# Patient Record
Sex: Female | Born: 1970 | Race: White | Hispanic: No
Health system: Midwestern US, Academic
[De-identification: ages and names within clinical notes are randomized; demographics above are authoritative.]

---

## 2010-04-02 IMAGING — US US PELVIC TRANSVAGINAL
1 series · 17 of 25 positions shown · non-contrast
Comparison: none

[Series 1: us pelvic transvaginal · 17 of 41 slices shown]
[im 1/41]
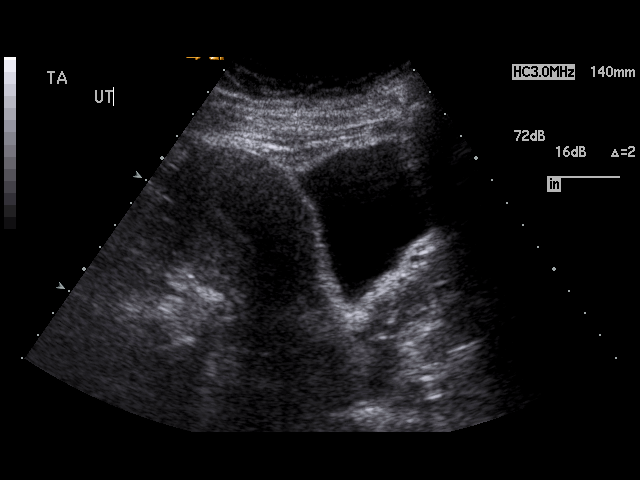
[im 4/41]
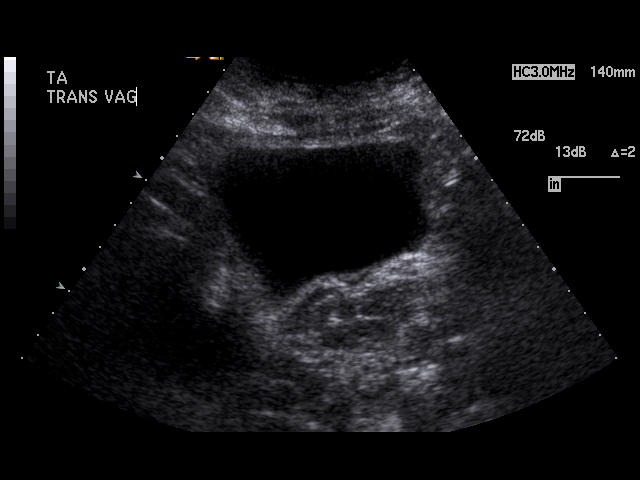
[im 6/41]
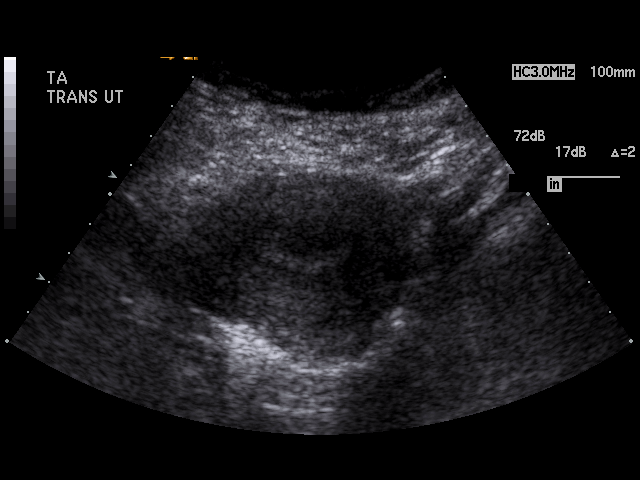
[im 9/41]
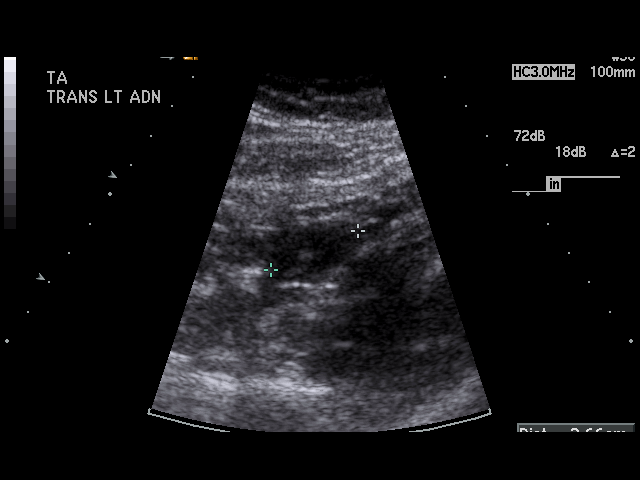
[im 11/41]
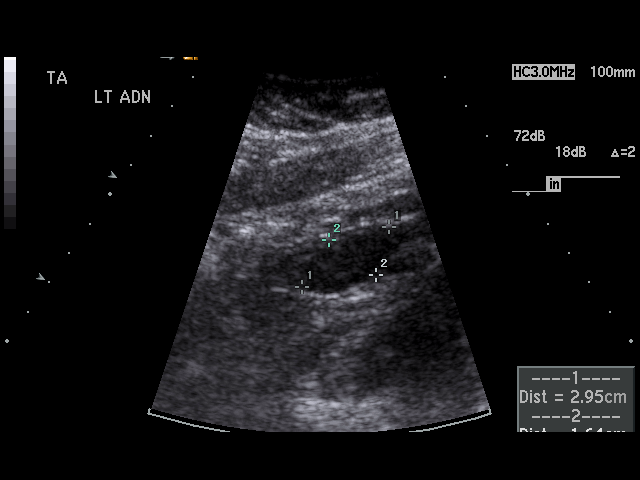
[im 14/41]
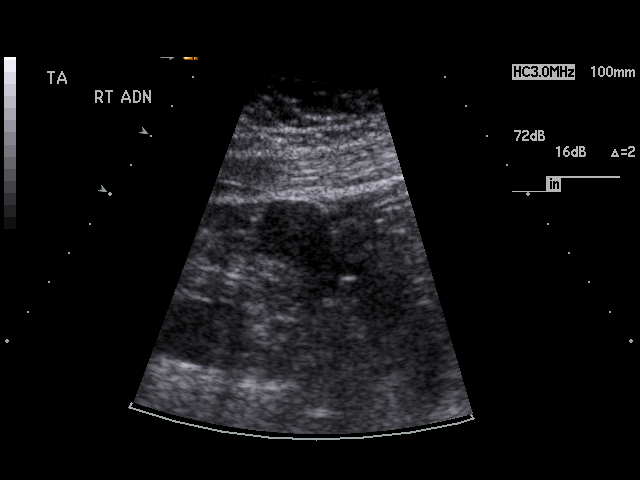
[im 16/41]
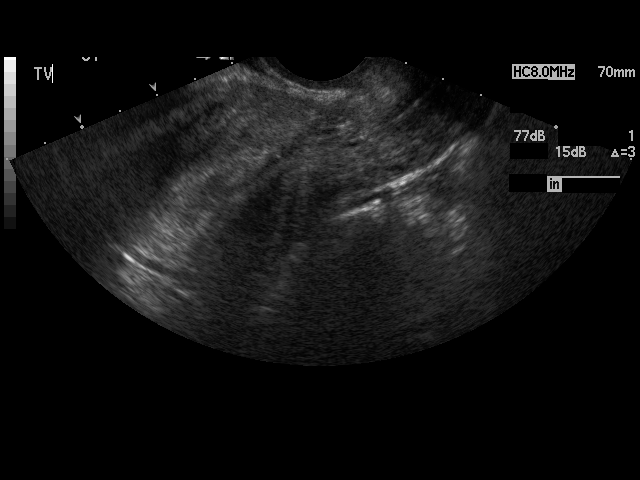
[im 19/41]
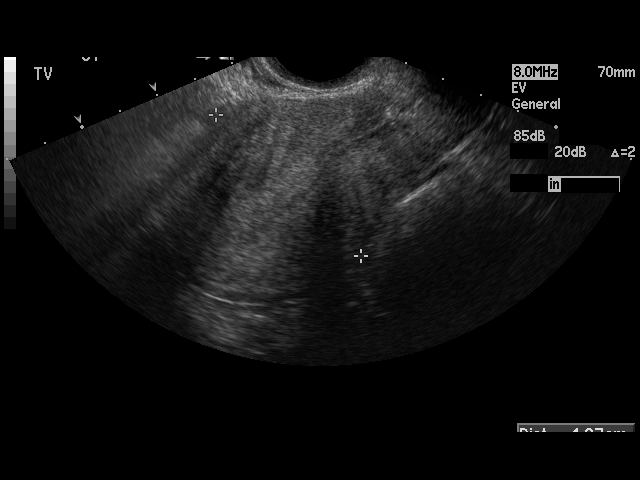
[im 21/41]
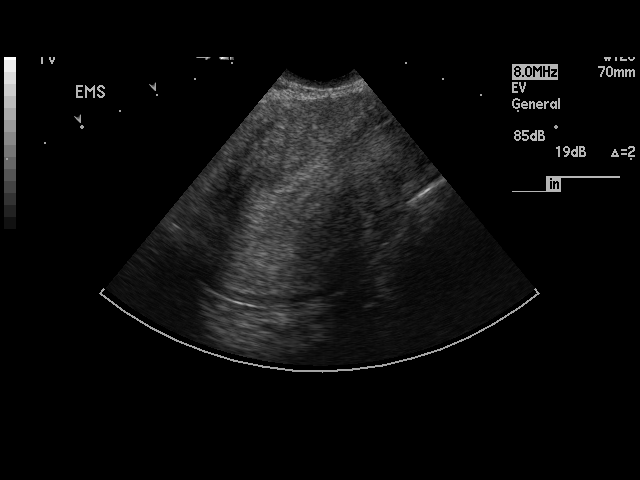
[im 22/41]
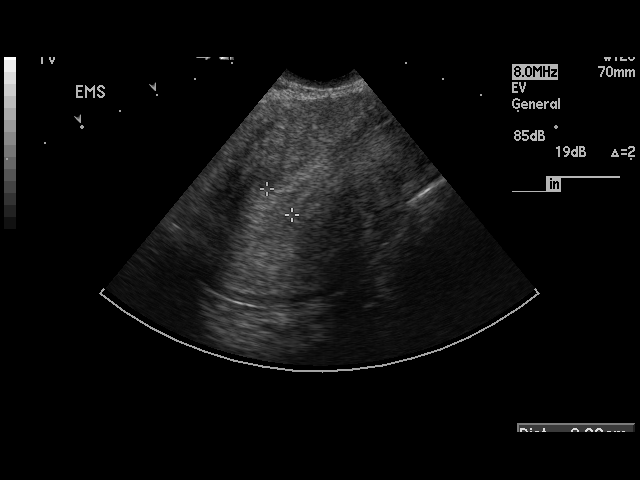
[im 26/41]
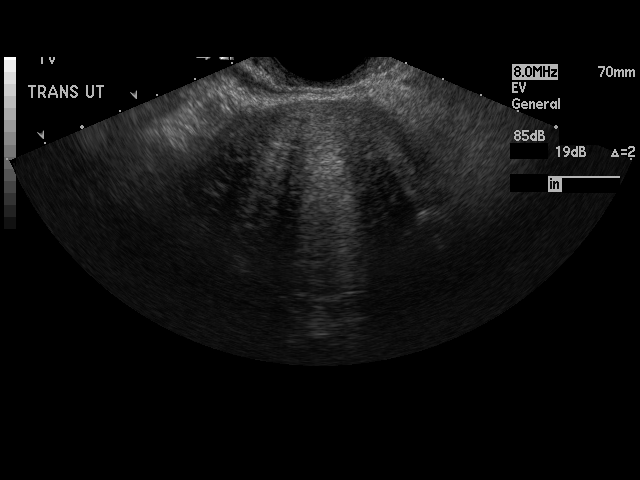
[im 27/41]
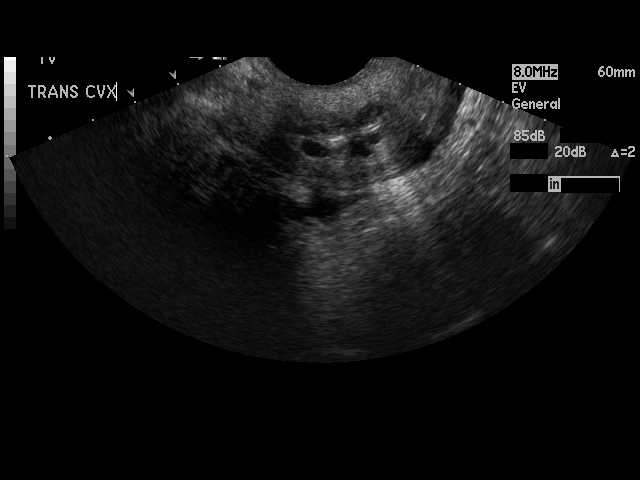
[im 31/41]
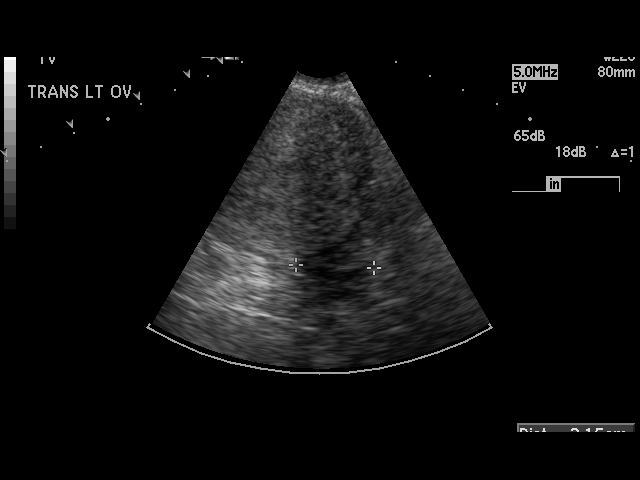
[im 32/41]
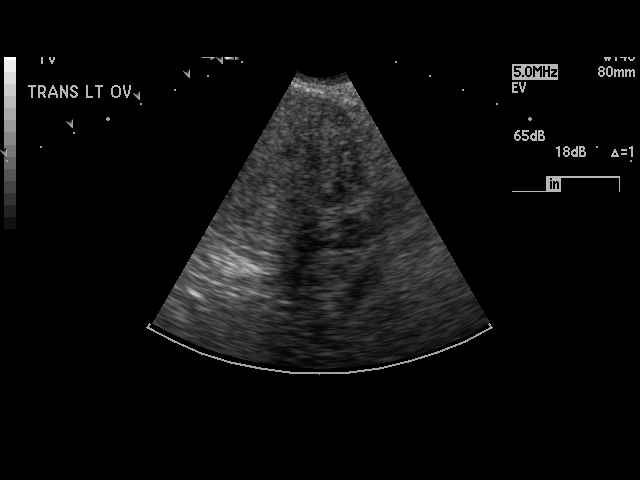
[im 36/41]
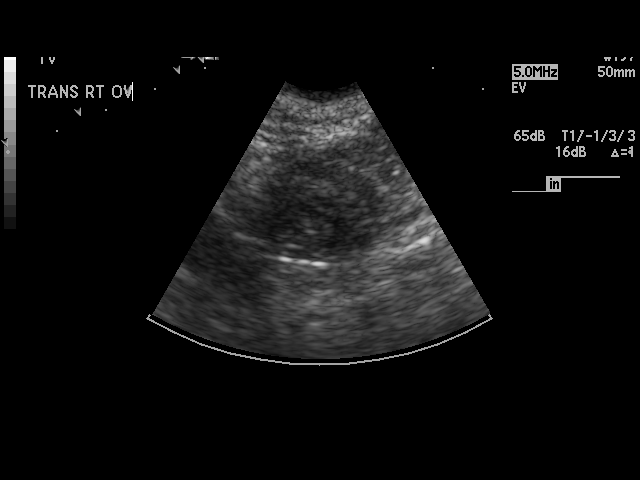
[im 37/41]
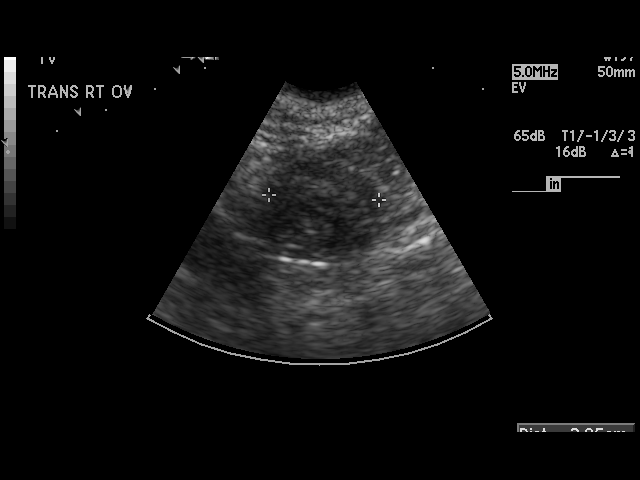
[im 41/41]
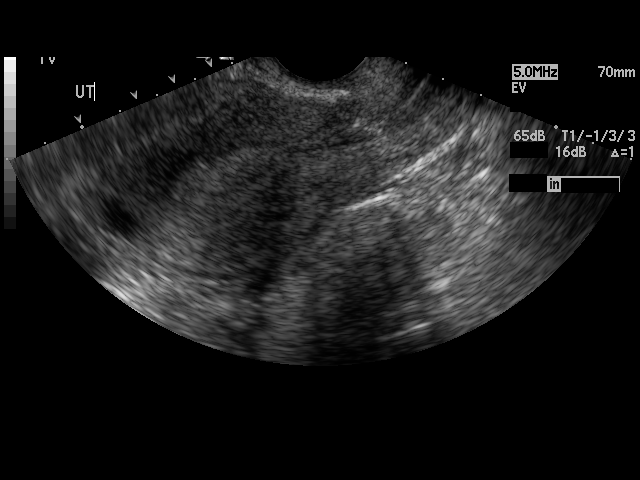

[17 of 25 positions shown; findings below may reference images not displayed]

PELVIC ULTRASOUND:` 
` 
INDICATIONS:  MENOMETRORRHAGIA. ` 
` 
Transabdominal and endovaginal imaging was obtained.  The uterus is ` 
enlarged mildly measuring 11.45 cm in length transabdominally.  The ` 
urinary bladder is unremarkable.  The left ovary measures 7.0x0 cm `
transabdominally.  The right ovary measures 9.0x0 cm transabdominally.  ` 
Endovaginally, the ovaries are normal in size measuring 4.9x4 on the right `
and the uterus is mildly enlarged at 10cm.  The endometrium does not `
appear thickened at 9 mm.  Endovaginally, the left ovary is unremarkable `
at 7x4.0 cm.  No evidence of free pelvic fluid or adnexal mass is ` 
identified.  `
` 
` 
` 
:  `
` 
Negative pelvic ultrasound apart from mild enlargement of the uterus.  `
` 
`

## 2017-06-27 ENCOUNTER — Encounter: Admit: 2017-06-27 | Discharge: 2017-06-27 | Payer: 59

## 2017-06-27 NOTE — Telephone Encounter
Service: LTC  Referring: Rennis Golden, MD  Urgency: Non Urgent  Provider: Requesting Franchot Mimes  Dx: Hep C, Elevated liver enzymes  OV Note: In outside records  Imaging/Location: None  Pathology/Location: None  Labs: In outside records

## 2017-06-27 NOTE — Telephone Encounter
Received new referral, via fax. Docs scanned in 06/27/17. Requesting Franchot Mimes, patient used to see her in 2015.

## 2017-07-01 ENCOUNTER — Encounter: Admit: 2017-07-01 | Discharge: 2017-07-01 | Payer: 59

## 2017-07-01 NOTE — Telephone Encounter
07/01/17  10:54am: 1st attempt: lvm for pt to return call to schd initial consult with Harriett Sine.

## 2017-07-21 ENCOUNTER — Encounter: Admit: 2017-07-21 | Discharge: 2017-07-21 | Payer: 59

## 2017-07-21 NOTE — Telephone Encounter
07/21/17  2:39pm: Called pt to schd initial consult with Harriett SineNancy.  The phone number we have on file [phone]) is no longer in service.  I called her emergency contact (Claudia Charley/parent) at [phone] and the man that answered said there was no one there by that name.  Unable to contact pt.  No appt has been scheduled.

## 2017-07-21 NOTE — Telephone Encounter
07/21/17  2:46pm: Called referring (Dr. Warden FillersMarianne Ray) - spoke with Dr. Denny Levyay's nurse, Rodney Boozeasha.  Advised we have been unable to contact this patient and no appt has been scheduled.

## 2017-10-15 ENCOUNTER — Encounter: Admit: 2017-10-15 | Discharge: 2017-10-15 | Payer: 59

## 2017-10-15 NOTE — Telephone Encounter
LVM reminding pt of appt.

## 2017-10-15 NOTE — Telephone Encounter
Patient left a voicemail wanting to speak to nurse. She did not state the reason.

## 2017-10-16 ENCOUNTER — Encounter: Admit: 2017-10-16 | Discharge: 2017-10-16 | Payer: 59

## 2017-10-16 DIAGNOSIS — B182 Chronic viral hepatitis C: Principal | ICD-10-CM

## 2017-10-16 NOTE — Telephone Encounter
Returned call LVM.  LVM that lab orders are in.

## 2017-10-21 ENCOUNTER — Ambulatory Visit: Admit: 2017-10-21 | Discharge: 2017-10-21 | Payer: 59

## 2017-10-21 ENCOUNTER — Encounter: Admit: 2017-10-21 | Discharge: 2017-10-21 | Payer: 59

## 2017-10-21 DIAGNOSIS — B1920 Unspecified viral hepatitis C without hepatic coma: ICD-10-CM

## 2017-10-21 DIAGNOSIS — B182 Chronic viral hepatitis C: Principal | ICD-10-CM

## 2017-10-21 DIAGNOSIS — F329 Major depressive disorder, single episode, unspecified: Principal | ICD-10-CM

## 2017-10-21 DIAGNOSIS — Z98891 History of uterine scar from previous surgery: ICD-10-CM

## 2017-10-21 DIAGNOSIS — A4902 Methicillin resistant Staphylococcus aureus infection, unspecified site: ICD-10-CM

## 2017-10-21 DIAGNOSIS — F1910 Other psychoactive substance abuse, uncomplicated: ICD-10-CM

## 2017-10-21 LAB — HEPATITIS B CORE AB TOT (IGG+IGM): Lab: NEGATIVE

## 2017-10-21 LAB — HEPATITIS B SURFACE AB: Lab: 9.3 m[IU]/mL — ABNORMAL LOW

## 2017-10-21 LAB — CBC AND DIFF
Lab: 10 % (ref 4–12)
Lab: 2.8 10*3/uL (ref 1.8–7.0)
Lab: 256 K/UL (ref 150–400)
Lab: 31 pg (ref 26–34)
Lab: 32 % — ABNORMAL HIGH (ref 24–44)
Lab: 32 g/dL (ref 32.0–36.0)
Lab: 4.7 M/UL (ref 4.0–5.0)
Lab: 45 % — ABNORMAL HIGH (ref 36–45)
Lab: 5.5 10*3/uL (ref 4.5–11.0)
Lab: 50 % (ref 41–77)
Lab: 7 % — ABNORMAL HIGH (ref >60)
Lab: 7.9 FL — ABNORMAL HIGH (ref 7–11)
Lab: 94 FL (ref 80–100)

## 2017-10-21 LAB — COMPREHENSIVE METABOLIC PANEL
Lab: 141 MMOL/L (ref 137–147)
Lab: 3.9 MMOL/L (ref 3.5–5.1)

## 2017-10-21 LAB — PROTIME INR (PT): Lab: 0.9 (ref 0.8–1.2)

## 2017-10-21 LAB — HEPATITIS B SURFACE AG: Lab: NEGATIVE

## 2017-10-21 LAB — HIV 1& 2 AG-AB SCRN W REFLEX HIV 1 PCR QUANT: Lab: NEGATIVE

## 2017-10-21 LAB — ALPHA FETO PROTEIN (AFP): Lab: 5.1 ng/mL (ref 0.0–15.0)

## 2017-10-21 NOTE — Progress Notes
Date of Service: 10/21/2017     Subjective:          Toni Reed is a 46 y.o. female presenting to establish care for liver disease.    History of Present Illness  Toni Reed is a 46 y.o. female  Toni Reed presents to hepatology clinic as new patient regarding chronic hepatitis C.  Last seen in the clinic Jan 2015.  Referred by PCP Toni Reed. PMH includes depression, multiple suicide attempts, MRSA.  Dx in 2006 with HCV, genotype 1a. Treatment naive. No staging. 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 She was then lost to follow up.  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 She was incarcerated recently for possession of meth. She has been attending NA weekly. She is interested in treatment and feels this is a good time in her life for treatment.  She understands the commitment to treatment. She is feeling well. Denies jaundice, ascites, edema, confusion, GI bleed, N/V, abdominal pain, change in bowel habits. + fatigue, arthralgia, and myalgia.      Past Medical History:  Past Medical History:   Diagnosis Date   ??? Depression    ??? Hepatitis C     Genotype 1a   ??? History of cesarean section    ??? MRSA (methicillin resistant Staphylococcus aureus) infection    ??? Substance abuse (HCC)        Surgical History:  C section, TLG, MRSA, ankle fracture.    Family History:  No liver problems.     SH:    + tobacco use < 1 PPD; denies h/o ETOH abuse, rarely consumes alcohol. + IVDU with meth starting age 70. She has had periods of sobriety with relapses. She has attended drug rehab multiple times. Last use Jan 2018. Currently attending NA weekly. Divorced,lives alone.  Employed full time as CNA at [employer].       Review of Systems  A complete 10 point review of systems was asked and answered in the negative unless specifically commented upon in the HPI.    Objective: ??? calcium carbonate (TUMS) 500 mg (200 mg elemental calcium) chewable tablet Chew 500 mg by mouth every 6 hours as needed.   ??? ibuprofen (ADVIL) 200 mg tablet Take 200 mg by mouth every 6 hours as needed for Pain. Take with food.     Vitals:    10/21/17 0849   BP: 107/69   Pulse: 80   Resp: 18   Temp: 36.6 ???C (97.9 ???F)   TempSrc: Oral   SpO2: 97%   Weight: 88 kg (194 lb)   Height: 160 cm (63)     Body mass index is 34.37 kg/m???.     Physical Exam    Vitals reviewed.   Constitutional: Well-developed, well-nourished, in no apparent distress.    HEENT: Normocephalic. no scleral icterus. Dentition fair.  Cardiac:  Regular rate and rhythm.    Respiratory: Clear to auscultation bilaterally.  No wheezes or rales.  GI:  Abdomen soft, non-distended, RUQ tenderness.  BS present. no shifting dullness. No overt hepatosplenomegaly.     Skin:  Skin is warm and dry. No rash noted.  No jaundice. no spider nevi noted.  no palmar erythema.  Peripheral Vascular: no lower extremity edema.   Musculoskeletal:  ROM intact, no muscle wasting.   Psychiatric: Normal mood and affect. Behavior is normal.  Neuro:  no asterixis, no tremor.  Labs and Diagnostic tests:  CBC w diff    Lab Results   Component Value Date/Time    WBC 5.5 10/21/2017 10:47 AM    RBC 4.78 10/21/2017 10:47 AM    HGB 14.8 10/21/2017 10:47 AM    HCT 45.2 (H) 10/21/2017 10:47 AM    MCV 94.6 10/21/2017 10:47 AM    MCH 31.1 10/21/2017 10:47 AM    MCHC 32.8 10/21/2017 10:47 AM    RDW 12.7 10/21/2017 10:47 AM    PLTCT 256 10/21/2017 10:47 AM    MPV 7.9 10/21/2017 10:47 AM    Lab Results   Component Value Date/Time    NEUT 50 10/21/2017 10:47 AM    ANC 2.80 10/21/2017 10:47 AM    LYMA 32 10/21/2017 10:47 AM    ALC 1.80 10/21/2017 10:47 AM    MONA 10 10/21/2017 10:47 AM    AMC 0.50 10/21/2017 10:47 AM    EOSA 7 (H) 10/21/2017 10:47 AM    AEC 0.40 10/21/2017 10:47 AM    BASA 1 10/21/2017 10:47 AM    ABC 0.00 10/21/2017 10:47 AM        Comprehensive Metabolic Profile Lab Results   Component Value Date/Time    NA 141 10/21/2017 10:47 AM    K 3.9 10/21/2017 10:47 AM    CL 108 10/21/2017 10:47 AM    CO2 26 10/21/2017 10:47 AM    GAP 7 10/21/2017 10:47 AM    BUN 10 10/21/2017 10:47 AM    CR 0.70 10/21/2017 10:47 AM    GLU 97 10/21/2017 10:47 AM    Lab Results   Component Value Date/Time    CA 9.4 10/21/2017 10:47 AM    ALBUMIN 4.3 10/21/2017 10:47 AM    TOTPROT 7.1 10/21/2017 10:47 AM    ALKPHOS 103 10/21/2017 10:47 AM    AST 48 (H) 10/21/2017 10:47 AM    ALT 79 (H) 10/21/2017 10:47 AM    TOTBILI 0.3 10/21/2017 10:47 AM    GFR >60 10/21/2017 10:47 AM    GFRAA >60 10/21/2017 10:47 AM        OSH MR reviewed:   Labs 06/2017:  WBC 5.7, hgb16.2, plt count 297, Na 141, K 4.7, glucose 72, C. 0.72, Ca 9.3, TP 7.3, albumin 4.2, TB 0.4, alk phos 93, AST 31, ALT 46, TSH 0.97.   Hepatitis C:   Hepatitis C PCR Quantitative   Date/Time Value Ref Range Status   12/09/2012 415,000  Final     Hepatitis C Genotype PCR   Date/Time Value Ref Range Status   01/20/2013 03:25 PM   Final    Type 1a  Detects all 6 HCV genotypes including types 1-6 and subtypes 1a, 1b,  2a/2c, 2b.  This test was developed and its performance characteristics  determined by [employer]. It has not been cleared or  approved by the U.S. Food and Drug Administration.-Results should be  used in conjunction with clinical findings, and should not form the  sole basis for a diagnosis or treatment decision.-   PCR tests are performed pursuant to a license agreement with [employer].  ____________________________________________________________  Testing Performed At:  [facility]  [address]  [place], [place] [address]  [phone]  CLIA ID: [id number]       Hepatitis B:   HBsAg   Date/Time Value Ref Range Status   10/21/2017 10:47 AM NEG NEG-NEG Final       Assessment and Plan:  Hepatitis  C:  - Reviewed recent labs showing HCV genotype 1a, HCV PCR 415,000IU/ml. Reviewed recent OSH MR with labs showing normal AST and mildly elevated ALT. Normal t. Bili, plt count.   - Reviewed with the patient what is hepatitis C and the natural progression of hepatitis C.  Provided the patient written education packet today.    - Discussed hepatitis C medication regimens and the benefit of therapy, side effects, administration, and importance of adherence while on therapy.  Provided information about insurance prior authorization and follow up education once approved.    - The patient was counseled to not share toothbrushes, nail files, or razors with anyone in order to prevent HCV.  Recommend all close contacts be checked for HCV.    - The patient was counseled to avoid alcohol as ETOH can worsen liver disease.  No herbal therapies.  Limit tylenol to no more than 2000mg  per day.  Limit ibuprofen to no more than 1200mg  per day.  Recommend barrier method birth control.    - she had workup in 2014 showing unremarkable chronic liver disease markers. Will update HIV, MELD, HBcAb total, HBsA, HBsAb, HCV PCR.   - Discussed importance of establishing significance of liver disease to guide future management.  Modalities include fibrosure, fibroscan, or percutaneous liver biopsy.  Plan to schedule abdominal ultrasound today to assess liver and spleen characteristics.  Additionally plan to order fibrosis score by means of fibrosure today. Plan to fibroscan one year post SVR to reassess fibrosis following HCV treatment.   - Once above results are reviewed and okay recommend application to insurance for treatment with Harvoni x 12 weeks.   The patient will be notified of approval or denial of medication through insurance by phone.  - If the patient is approved for hepatitis C treatment, the patient will require on treatment labs to include complete metabolic panel (CMP) and a hepatitis C PCR every 4 weeks while on treatment, 4 weeks post treatment, and upon sustained viral response (SVR 12) three months post treatment.     Hepatitis B reactivation risk:  - Hepatitis B Reactivation: We plan on obtaining hepatitis B serology including hepatitis B surface antigen and hepatitis B core antibody total to assess reactivation risk.  - The following will be used to monitor patients' hepatitis B reactivation risk while on direct acting agents (DAA).  Potential laboratory results  HBsAg Ag -   Anti HBc Total - HBsAg +   Anti HBc Total ??? or + HBsAg -   Anti HBc +   Monitoring plan No additional monitoring needed Consult with provider Monitor liver function tests (LFTs), HBsAg and HBV DNA every 4 weeks while on DAA treatment and with sustained virologic response labs (12 weeks post-treatment). If HBsAg becomes positive, HBV DNA is positive and/or LFT???s increase, provider consultation required.       Tobacco Abuse:  - The patient has a history of tobacco use and tobacco cessation was recommended.     Substance Abuse:   - The patient has a history of illicit substance abuse and abstinence was encouraged.   - encouraged her to continue with NA.  Counseling forms given today. Pt instructed to bring counseling forms with her to next visit.     Hepatocellular carcinoma screening:  - Plan to obtain an alpha feto protein (AFP).  Plan to schedule abdominal ultrasound to rule out liver lesions.     Immunizations:   Lab Results   Component Value Date/Time    HEPBSAB  9.3 10/21/2017 10:47 AM      Lab Results   Component Value Date/Time    HEPAIGG  01/20/2013 03:25 PM     POS  Note: Test type and methodology has changed.  The [facility] lab has discontinued the   test for Total Hep A Immunoglobulin.  This test has been replaced with more   specific testing.  The tests for Hep A IgG and Hep A IgM are both now available   and can be ordered.     - Plan to check a Hepatitis A IGG and Hepatitis B surface antibody to assess immunity. - We strongly recommend vaccinations for hepatitis A and B if the patient not immune.  - declined flu vaccine.     Follow Up:   6 months or sooner if needed. The patient is to call our office with any questions.       Thank you very much for the opportunity to participate in the care of this patient.  If you have any further questions, please don't hesitate to contact our office.    _____________________________  Franchot Mimes APRN   [facility]  Hepatology  Office Number: [phone]

## 2017-10-21 NOTE — Progress Notes
Hepatology Clinic Pharmacist Note    Toni Reed was seen in general hepatology clinic 10/21/17. she is presenting for follow up of liver disease due to hepatitis C.        Labs and Diagnostic tests:  CBC w diff    Lab Results   Component Value Date/Time    WBC 4.6 01/08/2014    RBC 4.74 08/31/2013 12:40 PM    HGB 15.5 01/08/2014    HCT 44.4 08/31/2013 12:40 PM    MCV 93.6 08/31/2013 12:40 PM    MCH 31.0 08/31/2013 12:40 PM    MCHC 33.1 08/31/2013 12:40 PM    RDW 12.6 08/31/2013 12:40 PM    PLTCT 248 01/08/2014    MPV 7.7 08/31/2013 12:40 PM    Lab Results   Component Value Date/Time    NEUT 58 08/31/2013 12:40 PM    ANC 2.48 01/08/2014    LYMA 29 08/31/2013 12:40 PM    ALC 2.20 08/31/2013 12:40 PM    MONA 7 08/31/2013 12:40 PM    AMC 0.50 08/31/2013 12:40 PM    EOSA 5 08/31/2013 12:40 PM    AEC 0.40 08/31/2013 12:40 PM    BASA 1 08/31/2013 12:40 PM    ABC 0.10 08/31/2013 12:40 PM        Comprehensive Metabolic Profile    Lab Results   Component Value Date/Time    NA 138 01/08/2014    K 4.0 01/08/2014    CL 110 08/31/2013 12:40 PM    CO2 27 08/31/2013 12:40 PM    GAP 3 (L) 08/31/2013 12:40 PM    BUN 17 08/31/2013 12:40 PM    CR 0.68 01/08/2014    GLU 122 01/08/2014    Lab Results   Component Value Date/Time    CA 9.0 01/08/2014    ALBUMIN 4.3 08/31/2013 12:40 PM    TOTPROT 7.1 08/31/2013 12:40 PM    ALKPHOS 93 01/08/2014    AST 28 01/08/2014    ALT 54 01/08/2014    TOTBILI 0.5 01/08/2014    GFR >60 08/31/2013 12:40 PM    GFRAA >60 08/31/2013 12:40 PM          Hepatitis C:   Hepatitis C PCR Quantitative   Date/Time Value Ref Range Status   12/09/2012 415,000  Final     Hepatitis C Genotype PCR   Date/Time Value Ref Range Status   01/20/2013 03:25 PM   Final    Type 1a  Detects all 6 HCV genotypes including types 1-6 and subtypes 1a, 1b,  2a/2c, 2b.  This test was developed and its performance characteristics  determined by [employer]. It has not been cleared or approved by the U.S. Food and Drug Administration.-Results should be  used in conjunction with clinical findings, and should not form the  sole basis for a diagnosis or treatment decision.-   PCR tests are performed pursuant to a license agreement with [employer].  ____________________________________________________________  Testing Performed At:  [facility]  [address]  [place], [place] [address]  [phone]  CLIA ID: [id number]       Hepatitis B:   HBsAg   Date/Time Value Ref Range Status   01/08/2014 Non- Reactive  Final   Hepatitis B core antibody total needed    Medication Reconciliation:  A medication history and reconciliation were performed (including prescription medications, supplements, over the counter, and herbal products). The medication list was updated and the patients??? current medication  list is included below.     Home Medications    Medication Sig   calcium carbonate (TUMS) 500 mg (200 mg elemental calcium) chewable tablet Chew 500 mg by mouth every 6 hours as needed.   ibuprofen (ADVIL) 200 mg tablet Take 200 mg by mouth every 6 hours as needed for Pain. Take with food.       The patient was instructed to speak with their health care provider before starting any new drug, including prescription or over the counter, natural / herbal products, or vitamins.     Drug-Drug Interactions: No new significant drug-drug or drug-food interactions were identified.    Drug-Food Interactions: Drug-food interactions were evaluated and there are no significant drug-food interactions.       Assessment and Plan:  - The patient presents with hepatitis C genotype 1a.  Based on the current HCV Guidance report (www.hcvguidelines.org) from the American Association for the Study of Liver Diseases (AASLD) and Infectious Disease Society of America (IDSA) we will recommend treatment with Harvoni for 12 weeks.   -  The patient was provided information about insurance prior authorization and the treatment protocol necessary to obtain approval through insurance. Once lab results are reviewed with the hepatology provider and appropriate medication protocol is determined, [facility]) specialty pharmacy will apply for hepatitis C treatment via prior authorization through insurance.  The patient will be notified of approval or denial of medication through insurance by phone.    Immunizations: Recommended immunizations for hepatitis B.  Lab Results   Component Value Date/Time    HEPBSAB <2.5 01/20/2013 03:25 PM      Lab Results   Component Value Date/Time    HEPAIGG  01/20/2013 03:25 PM     POS  Note: Test type and methodology has changed.  The [facility] lab has discontinued the   test for Total Hep A Immunoglobulin.  This test has been replaced with more   specific testing.  The tests for Hep A IgG and Hep A IgM are both now available   and can be ordered.           Follow up Plan:   Toni Reed was given the opportunity to ask questions and did not have any questions at this time.. The patient was encouraged to call the hepatology pharmacist with any questions.     Toni Reed, PharmD, AAHIVP  [facility]  Hepatology  Phone Number: [phone]  Email: [email or web address]

## 2017-10-22 ENCOUNTER — Encounter: Admit: 2017-10-22 | Discharge: 2017-10-22 | Payer: 59

## 2017-10-22 LAB — LIVER FIBROSIS, FIBRO TEST
Lab: 0.2
Lab: 0.5
Lab: 224
Lab: 80
Lab: 91 — ABNORMAL HIGH

## 2017-10-22 MED ORDER — LEDIPASVIR-SOFOSBUVIR 90-400 MG PO TAB
1 | ORAL_TABLET | Freq: Every day | ORAL | 2 refills | Status: AC
Start: 2017-10-22 — End: 2017-12-03

## 2017-10-23 ENCOUNTER — Encounter: Admit: 2017-10-23 | Discharge: 2017-10-23 | Payer: 59

## 2017-10-23 LAB — HEPATITIS C VIRAL LOAD PCR QUANT
Lab: 5.7 [IU]/mL — ABNORMAL HIGH (ref <1.08)
Lab: 548 [IU]/mL — ABNORMAL HIGH (ref <12)

## 2017-10-23 NOTE — Telephone Encounter
[place]    Lab results reviewed with patient.  LFTs elevated.  CBC and BMP stable.  Anti HB titer is low and you may need a booster.  Will follow up with NP Franchot MimesNancy Todd.  Fibrosure indicates no fibrosis.

## 2017-10-23 NOTE — Telephone Encounter
[facility] @ [phone]

## 2017-10-27 ENCOUNTER — Encounter: Admit: 2017-10-27 | Discharge: 2017-10-27 | Payer: 59

## 2017-10-27 NOTE — Progress Notes
The Prior Authorization for Harvoni was submitted for Nicole KindredBrook Arlene Lepak via Cover My Meds.  Will continue to follow.    Maxie BetterJules Adryen Cookson  Pharmacy Patient Advocate  [phone]

## 2017-10-30 ENCOUNTER — Encounter: Admit: 2017-10-30 | Discharge: 2017-10-30 | Payer: 59

## 2017-10-30 NOTE — Telephone Encounter
-----   Message from Franchot MimesNancy Todd, APRN sent at 10/26/2017  2:56 PM CST -----  fibrotests estimates fibrosis at F0-1/4 - no fibrosis.   Immunity to HBV is equivocal.  Recommend HBV booster dose and then recheck antibody level for immunity.   AFP is normal.   Liver enzymes are mildly elevated.   HBcAb total is negative.   Other labs look okay.    Move forward with HCV treatment application for genotype 1a protocol that her insurance will cover.   Contact pt with results and recommendations.

## 2017-10-30 NOTE — Telephone Encounter
Patient will get HBV vaccine at PCP office.    Lab results discussed with patient.

## 2017-10-30 NOTE — Progress Notes
I called to check the status of the prior authorization for Harvoni and Monia Pouchetna stated they are still processing the prior authorization and won't have a decision until after 11/11/2017. I will continue to follow.    Maxie BetterJules Roald Lukacs  Pharmacy Patient Advocate  [phone]

## 2017-11-07 ENCOUNTER — Encounter: Admit: 2017-11-07 | Discharge: 2017-11-07 | Payer: 59

## 2017-11-07 DIAGNOSIS — B182 Chronic viral hepatitis C: Principal | ICD-10-CM

## 2017-11-10 ENCOUNTER — Encounter: Admit: 2017-11-10 | Discharge: 2017-11-10 | Payer: 59

## 2017-11-10 NOTE — Telephone Encounter
-----   Message from Franchot MimesNancy Todd, APRN sent at 11/07/2017  1:51 PM CST -----  OSH Abd US 10/30/17 shows hepatomegaly, no liver lesion. Spleen is normal. No ascites. Contact pt with results.

## 2017-11-10 NOTE — Telephone Encounter
Notified patient of results.  Notified Toni Reed will not have a decision on Harvoni until after 11/11/2017.

## 2017-11-17 ENCOUNTER — Encounter: Admit: 2017-11-17 | Discharge: 2017-11-17 | Payer: 59

## 2017-11-25 ENCOUNTER — Encounter: Admit: 2017-11-25 | Discharge: 2017-11-25 | Payer: 59

## 2017-11-28 ENCOUNTER — Encounter: Admit: 2017-11-28 | Discharge: 2017-11-28 | Payer: 59

## 2017-12-03 ENCOUNTER — Encounter: Admit: 2017-12-03 | Discharge: 2017-12-03 | Payer: 59

## 2017-12-03 MED ORDER — LEDIPASVIR-SOFOSBUVIR 90-400 MG PO TAB
1 | ORAL_TABLET | Freq: Every day | ORAL | 1 refills | Status: AC
Start: 2017-12-03 — End: 2017-12-03

## 2017-12-12 ENCOUNTER — Encounter: Admit: 2017-12-12 | Discharge: 2017-12-12 | Payer: 59

## 2017-12-16 ENCOUNTER — Encounter: Admit: 2017-12-16 | Discharge: 2017-12-16 | Payer: 59

## 2017-12-18 ENCOUNTER — Encounter: Admit: 2017-12-18 | Discharge: 2017-12-18 | Payer: 59

## 2017-12-22 ENCOUNTER — Encounter: Admit: 2017-12-22 | Discharge: 2017-12-22 | Payer: 59

## 2017-12-24 ENCOUNTER — Encounter: Admit: 2017-12-24 | Discharge: 2017-12-24 | Payer: 59

## 2017-12-25 ENCOUNTER — Encounter: Admit: 2017-12-25 | Discharge: 2017-12-25 | Payer: 59

## 2017-12-25 DIAGNOSIS — B182 Chronic viral hepatitis C: Principal | ICD-10-CM

## 2017-12-29 ENCOUNTER — Encounter: Admit: 2017-12-29 | Discharge: 2017-12-29 | Payer: 59

## 2017-12-31 ENCOUNTER — Encounter: Admit: 2017-12-31 | Discharge: 2017-12-31 | Payer: 59

## 2018-01-15 ENCOUNTER — Encounter: Admit: 2018-01-15 | Discharge: 2018-01-15 | Payer: 59

## 2018-01-22 ENCOUNTER — Encounter: Admit: 2018-01-22 | Discharge: 2018-01-22 | Payer: 59

## 2018-01-22 DIAGNOSIS — B182 Chronic viral hepatitis C: Principal | ICD-10-CM

## 2018-01-22 LAB — COMPREHENSIVE METABOLIC PANEL
Lab: 104
Lab: 108
Lab: 15
Lab: 20
Lab: 4.1
Lab: 4.1
Lab: 9.4
Lab: 0.4 mg/dL
Lab: 0.6 mg/dL
Lab: 11
Lab: 120
Lab: 13
Lab: 139 mmol/L
Lab: 6.7
Lab: 68

## 2018-01-22 LAB — CBC AND DIFF
Lab: 12
Lab: 15
Lab: 30
Lab: 32
Lab: 484
Lab: 546 — ABNORMAL HIGH (ref 15–500)
Lab: 6.2
Lab: 8.8
Lab: 1.3
Lab: 219
Lab: 286
Lab: 35
Lab: 4.9
Lab: 45 — ABNORMAL HIGH (ref 35.0–45.0)
Lab: 46
Lab: 7.8
Lab: 81
Lab: 93

## 2018-01-26 ENCOUNTER — Encounter: Admit: 2018-01-26 | Discharge: 2018-01-26 | Payer: 59

## 2018-01-26 DIAGNOSIS — B182 Chronic viral hepatitis C: Principal | ICD-10-CM

## 2018-01-26 LAB — HEPATITIS C VIRAL LOAD PCR QUANT: Lab: <15

## 2018-02-09 ENCOUNTER — Encounter: Admit: 2018-02-09 | Discharge: 2018-02-09 | Payer: 59

## 2018-02-26 ENCOUNTER — Encounter: Admit: 2018-02-26 | Discharge: 2018-02-26 | Payer: 59

## 2018-02-26 DIAGNOSIS — B182 Chronic viral hepatitis C: Principal | ICD-10-CM

## 2018-02-26 LAB — COMPREHENSIVE METABOLIC PANEL
Lab: 106
Lab: 118
Lab: 18
Lab: 18
Lab: 4.5
Lab: 6.8
Lab: 79
Lab: 9.5
Lab: 0.4 mg/dL
Lab: 0.6 mg/dL
Lab: 102
Lab: 138 mmol/L
Lab: 27
Lab: 4.3
Lab: 9
Lab: 97

## 2018-03-02 ENCOUNTER — Encounter: Admit: 2018-03-02 | Discharge: 2018-03-02 | Payer: 59

## 2018-03-03 ENCOUNTER — Encounter: Admit: 2018-03-03 | Discharge: 2018-03-03 | Payer: 59

## 2018-03-03 DIAGNOSIS — B182 Chronic viral hepatitis C: Principal | ICD-10-CM

## 2018-03-03 LAB — HEPATITIS C VIRAL LOAD PCR QUANT: Lab: <15

## 2018-03-12 ENCOUNTER — Encounter: Admit: 2018-03-12 | Discharge: 2018-03-12 | Payer: 59

## 2018-03-23 ENCOUNTER — Encounter: Admit: 2018-03-23 | Discharge: 2018-03-23 | Payer: 59

## 2018-03-27 ENCOUNTER — Encounter: Admit: 2018-03-27 | Discharge: 2018-03-27 | Payer: 59

## 2018-03-30 ENCOUNTER — Encounter: Admit: 2018-03-30 | Discharge: 2018-03-30 | Payer: 59

## 2018-03-30 DIAGNOSIS — B182 Chronic viral hepatitis C: Principal | ICD-10-CM

## 2018-03-30 LAB — COMPREHENSIVE METABOLIC PANEL
Lab: 0.3 mg/dL — ABNORMAL LOW (ref 1.8–7.0)
Lab: 0.7 mg/dL — ABNORMAL HIGH (ref 3–12)
Lab: 10 mg/dL — ABNORMAL HIGH (ref 21–30)
Lab: 107 mL/min/{1.73_m2} — ABNORMAL HIGH (ref 7–25)
Lab: 107 mmol/L — ABNORMAL LOW (ref >60)
Lab: 12 U/L (ref 0–0.45)
Lab: 124 mg/dL — ABNORMAL HIGH (ref 65–99)
Lab: 13 U/L (ref 0–0.80)
Lab: 139 mmol/L — ABNORMAL HIGH (ref 0.4–1.24)
Lab: 19 mmol/l — ABNORMAL LOW (ref >60)
Lab: 4 g/dL (ref 0–2)
Lab: 4.2 mmol/L — ABNORMAL LOW (ref 8.5–10.6)
Lab: 6.5 g/dL (ref 0–5)
Lab: 70 U/L — ABNORMAL LOW (ref 1.0–4.8)
Lab: 9 mg/dL (ref 4–12)
Lab: 92 mL/min/1.73m2 — ABNORMAL HIGH (ref 70–100)

## 2018-03-30 LAB — HEPATITIS C VIRAL LOAD PCR QUANT: Lab: <15 [IU]/mL — ABNORMAL LOW (ref 3.5–5.1)

## 2018-04-02 ENCOUNTER — Encounter: Admit: 2018-04-02 | Discharge: 2018-04-02 | Payer: 59

## 2018-05-18 ENCOUNTER — Encounter: Admit: 2018-05-18 | Discharge: 2018-05-18 | Payer: 59

## 2018-05-19 ENCOUNTER — Encounter: Admit: 2018-05-19 | Discharge: 2018-05-19 | Payer: 59

## 2018-05-19 DIAGNOSIS — B182 Chronic viral hepatitis C: Principal | ICD-10-CM

## 2018-05-19 LAB — CBC AND DIFF
Lab: 0
Lab: 4.8
Lab: 0
Lab: 10 g/dL
Lab: 12 mg/dL
Lab: 15 mL/min/1.73m2 — ABNORMAL HIGH (ref 11.7–15.5)
Lab: 287 U/L
Lab: 30 mmol/L
Lab: 304 g/dL
Lab: 32 — ABNORMAL LOW (ref 20–32)
Lab: 36
Lab: 374 U/L
Lab: 401 mg/dL
Lab: 49 mmol/L — ABNORMAL HIGH (ref 35.0–45.0)
Lab: 5.2 mL/min/{1.73_m2} — ABNORMAL HIGH (ref 3.80–5.10)
Lab: 51
Lab: 530 U/L — ABNORMAL HIGH (ref 15–500)
Lab: 6.8
Lab: 7.8 mg/dL (ref 0.50–1.10)
Lab: 95 mmol/L

## 2018-05-19 LAB — COMPREHENSIVE METABOLIC PANEL
Lab: 11 mg/dL (ref 7–25)
Lab: 110 s — ABNORMAL HIGH (ref 65–99)

## 2018-05-19 LAB — HEPATITIS C VIRAL LOAD PCR QUANT: Lab: <15 [IU]/mL

## 2018-05-19 LAB — PROTIME INR (PT): Lab: 0.9 {Log_IU}/mL

## 2018-05-25 ENCOUNTER — Encounter: Admit: 2018-05-25 | Discharge: 2018-05-25 | Payer: 59

## 2018-05-26 ENCOUNTER — Encounter: Admit: 2018-05-26 | Discharge: 2018-05-26 | Payer: 59

## 2018-05-28 ENCOUNTER — Encounter: Admit: 2018-05-28 | Discharge: 2018-05-28 | Payer: 59

## 2018-06-04 ENCOUNTER — Encounter: Admit: 2018-06-04 | Discharge: 2018-06-04 | Payer: 59

## 2018-06-04 DIAGNOSIS — Z8619 Personal history of other infectious and parasitic diseases: Principal | ICD-10-CM

## 2018-06-04 DIAGNOSIS — K769 Liver disease, unspecified: ICD-10-CM

## 2018-09-22 ENCOUNTER — Encounter: Admit: 2018-09-22 | Discharge: 2018-09-22 | Payer: 59

## 2018-09-28 ENCOUNTER — Encounter: Admit: 2018-09-28 | Discharge: 2018-09-28 | Payer: 59

## 2018-09-28 ENCOUNTER — Ambulatory Visit: Admit: 2018-09-28 | Discharge: 2018-09-28 | Payer: 59

## 2018-09-28 DIAGNOSIS — K769 Liver disease, unspecified: ICD-10-CM

## 2018-09-28 DIAGNOSIS — B1920 Unspecified viral hepatitis C without hepatic coma: ICD-10-CM

## 2018-09-28 DIAGNOSIS — F329 Major depressive disorder, single episode, unspecified: Principal | ICD-10-CM

## 2018-09-28 DIAGNOSIS — Z8619 Personal history of other infectious and parasitic diseases: Secondary | ICD-10-CM

## 2018-09-28 DIAGNOSIS — B182 Chronic viral hepatitis C: Secondary | ICD-10-CM

## 2018-09-28 DIAGNOSIS — Z23 Encounter for immunization: ICD-10-CM

## 2018-09-28 DIAGNOSIS — Z98891 History of uterine scar from previous surgery: ICD-10-CM

## 2018-09-28 DIAGNOSIS — K740 Hepatic fibrosis: ICD-10-CM

## 2018-09-28 DIAGNOSIS — F1910 Other psychoactive substance abuse, uncomplicated: ICD-10-CM

## 2018-09-28 DIAGNOSIS — A4902 Methicillin resistant Staphylococcus aureus infection, unspecified site: ICD-10-CM

## 2018-09-28 LAB — CBC AND DIFF
Lab: 0.1 10*3/uL (ref 0–0.20)
Lab: 0.4 10*3/uL (ref 0–0.45)
Lab: 0.6 10*3/uL (ref 0–0.80)
Lab: 1 % (ref 0–2)
Lab: 2.1 10*3/uL (ref 1.0–4.8)
Lab: 29 % (ref 24–44)
Lab: 31 pg (ref 26–34)
Lab: 322 10*3/uL (ref >60)
Lab: 34 g/dL — ABNORMAL HIGH (ref 32.0–36.0)
Lab: 4.2 10*3/uL (ref 1.8–7.0)
Lab: 4.9 M/UL — ABNORMAL HIGH (ref 4.0–5.0)
Lab: 46 % — ABNORMAL HIGH (ref 36–45)
Lab: 5 % (ref 0–5)
Lab: 57 % (ref >60)
Lab: 7.3 10*3/uL (ref 4.5–11.0)
Lab: 8 % (ref 4–12)

## 2018-09-28 LAB — COMPREHENSIVE METABOLIC PANEL
Lab: 0.8 mg/dL (ref 0.4–1.00)
Lab: 103 MMOL/L — ABNORMAL HIGH (ref 98–110)
Lab: 105 mg/dL — ABNORMAL HIGH (ref 70–100)
Lab: 13 mg/dL (ref 7–25)
Lab: 142 MMOL/L (ref 137–147)
Lab: 4.5 MMOL/L (ref 3.5–5.1)

## 2018-09-28 LAB — PROTIME INR (PT): Lab: 0.9 (ref 0.8–1.2)

## 2018-09-28 LAB — ALPHA FETO PROTEIN (AFP): Lab: 3.9 ng/mL (ref 0.0–15.0)

## 2018-09-29 LAB — HEPATITIS B SURFACE AB: Lab: 47 m[IU]/mL

## 2018-10-01 LAB — HEPATITIS C VIRAL LOAD PCR QUANT

## 2018-10-02 ENCOUNTER — Encounter: Admit: 2018-10-02 | Discharge: 2018-10-02 | Payer: 59

## 2018-10-02 DIAGNOSIS — K740 Hepatic fibrosis: ICD-10-CM

## 2018-10-02 DIAGNOSIS — B182 Chronic viral hepatitis C: Principal | ICD-10-CM

## 2018-10-02 DIAGNOSIS — K769 Liver disease, unspecified: ICD-10-CM

## 2018-10-16 ENCOUNTER — Ambulatory Visit: Admit: 2018-10-16 | Discharge: 2018-10-16 | Payer: 59

## 2018-10-16 DIAGNOSIS — B182 Chronic viral hepatitis C: Principal | ICD-10-CM

## 2018-10-16 DIAGNOSIS — K740 Hepatic fibrosis: ICD-10-CM

## 2018-10-16 DIAGNOSIS — K769 Liver disease, unspecified: ICD-10-CM

## 2018-10-22 ENCOUNTER — Encounter: Admit: 2018-10-22 | Discharge: 2018-10-22 | Payer: 59

## 2018-10-22 DIAGNOSIS — Z8619 Personal history of other infectious and parasitic diseases: ICD-10-CM

## 2018-10-22 DIAGNOSIS — K769 Liver disease, unspecified: ICD-10-CM

## 2018-10-22 DIAGNOSIS — K740 Hepatic fibrosis: ICD-10-CM

## 2018-10-22 DIAGNOSIS — B182 Chronic viral hepatitis C: Principal | ICD-10-CM

## 2018-10-27 ENCOUNTER — Encounter: Admit: 2018-10-27 | Discharge: 2018-10-27 | Payer: 59

## 2018-10-27 DIAGNOSIS — B182 Chronic viral hepatitis C: Principal | ICD-10-CM

## 2018-10-27 DIAGNOSIS — K740 Hepatic fibrosis: ICD-10-CM

## 2018-11-12 ENCOUNTER — Ambulatory Visit: Admit: 2018-11-12 | Discharge: 2018-11-12 | Payer: 59

## 2018-11-12 DIAGNOSIS — Z8619 Personal history of other infectious and parasitic diseases: Secondary | ICD-10-CM

## 2018-11-12 DIAGNOSIS — K740 Hepatic fibrosis: Secondary | ICD-10-CM

## 2018-11-12 DIAGNOSIS — K769 Liver disease, unspecified: Secondary | ICD-10-CM

## 2018-11-12 MED ORDER — SODIUM CHLORIDE 0.9 % IJ SOLN
50 mL | Freq: Once | INTRAVENOUS | 0 refills | Status: CP
Start: 2018-11-12 — End: ?
  Administered 2018-11-12: 16:00:00 50 mL via INTRAVENOUS

## 2018-11-12 MED ORDER — IOHEXOL 350 MG IODINE/ML IV SOLN
100 mL | Freq: Once | INTRAVENOUS | 0 refills | Status: CP
Start: 2018-11-12 — End: ?
  Administered 2018-11-12: 16:00:00 100 mL via INTRAVENOUS

## 2018-11-23 ENCOUNTER — Encounter: Admit: 2018-11-23 | Discharge: 2018-11-23 | Payer: 59

## 2018-11-23 DIAGNOSIS — K769 Liver disease, unspecified: Secondary | ICD-10-CM

## 2018-11-23 DIAGNOSIS — Z8619 Personal history of other infectious and parasitic diseases: Secondary | ICD-10-CM

## 2018-11-23 DIAGNOSIS — B182 Chronic viral hepatitis C: Secondary | ICD-10-CM

## 2018-11-23 DIAGNOSIS — K740 Hepatic fibrosis: Secondary | ICD-10-CM

## 2019-02-07 IMAGING — US US ABDOMEN COMPLETE
1 series · 14 of 25 positions shown · non-contrast
Comparison: None available.

Abdominal ultrasound, complete with color flow doppler imaging.
HISTORY: Chronic hepatitis C.

[Series 1: us abdomen complete · 14 of 67 slices shown]
[im 1/67]
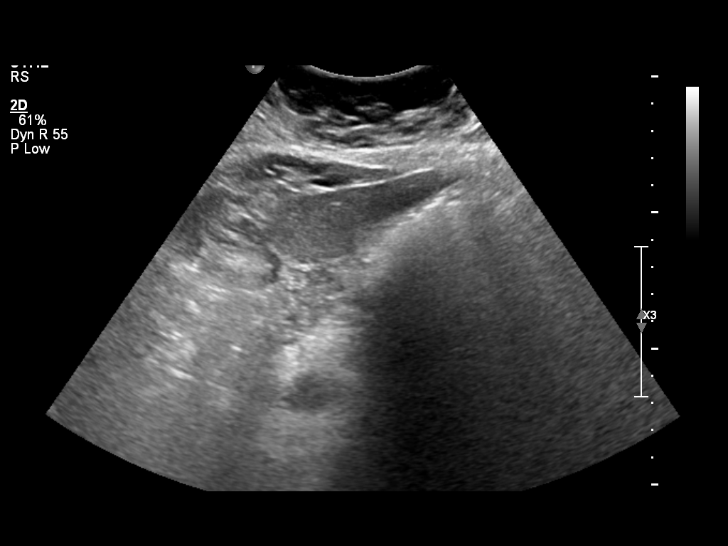
[im 6/67]
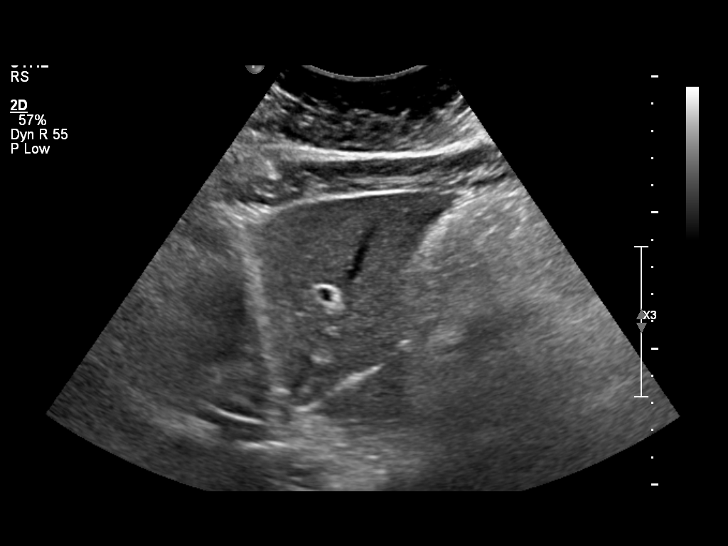
[im 12/67]
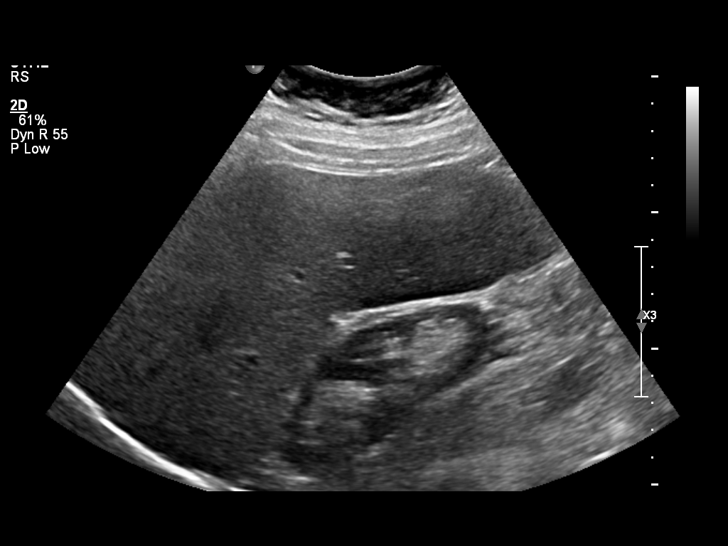
[im 17/67]
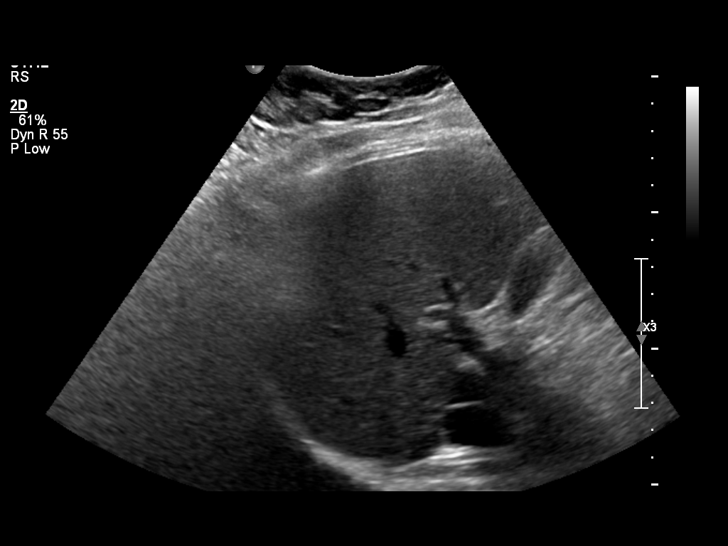
[im 23/67]
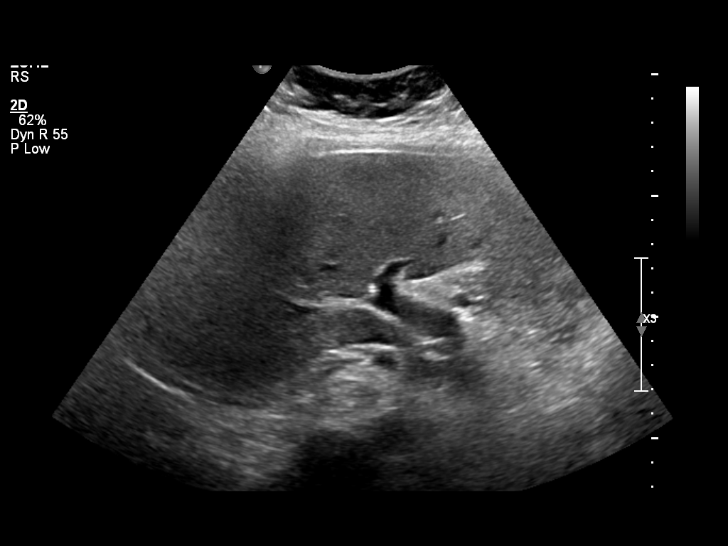
[im 25/67]
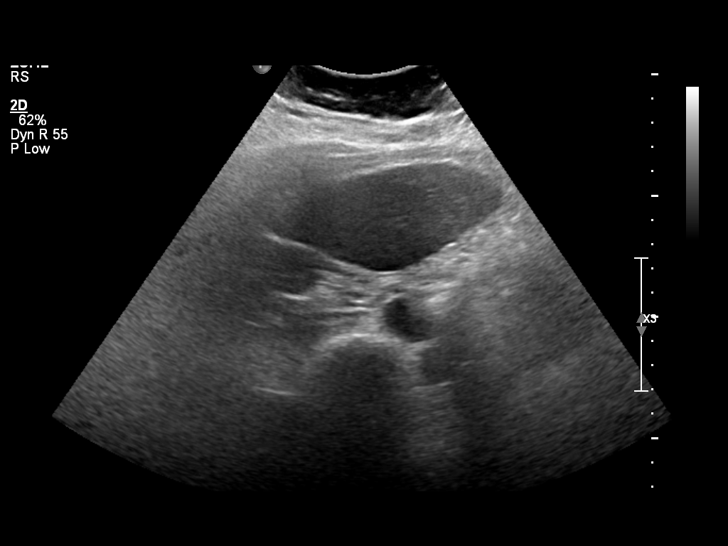
[im 31/67]
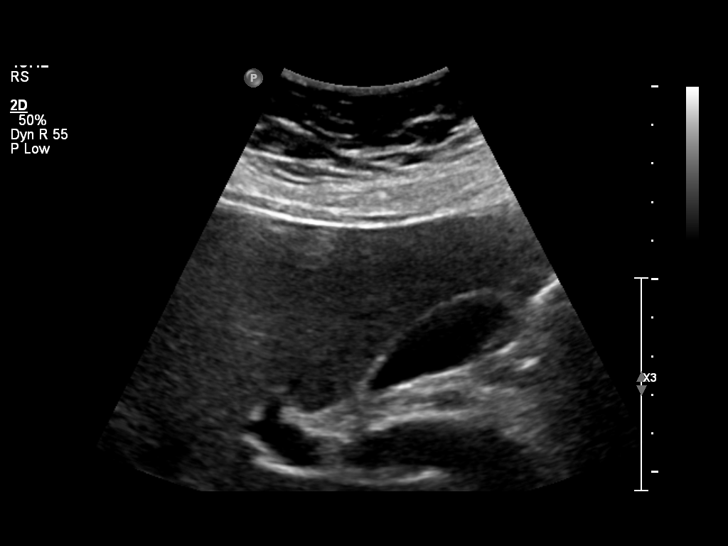
[im 36/67]
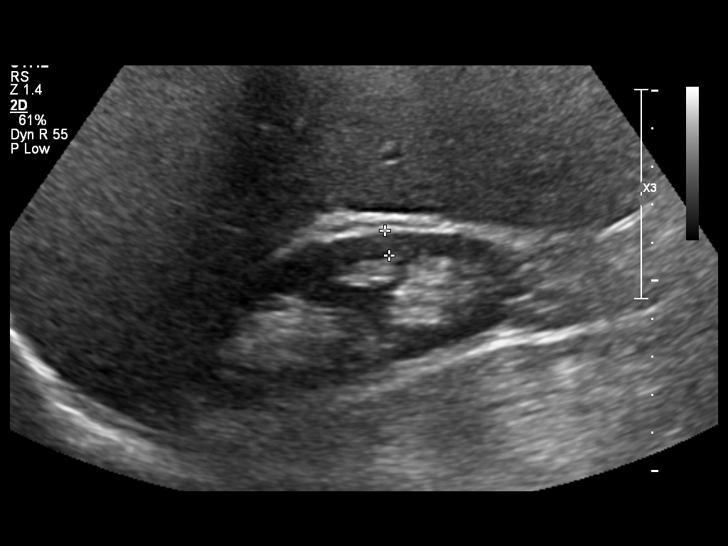
[im 42/67]
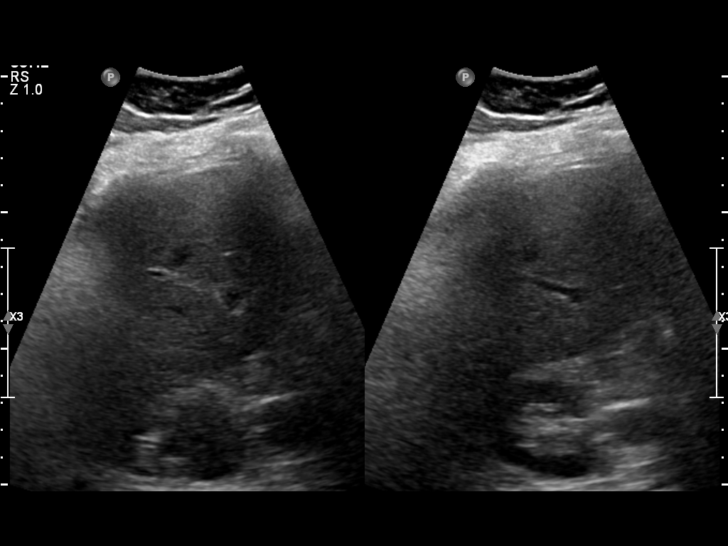
[im 45/67]
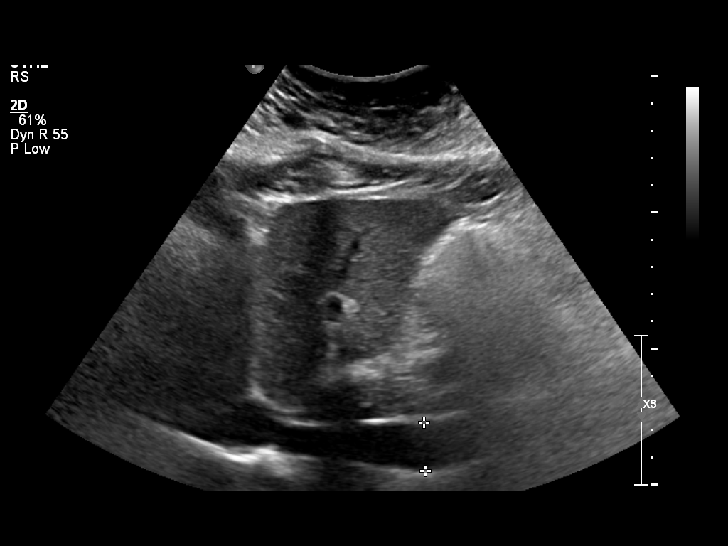
[im 50/67]
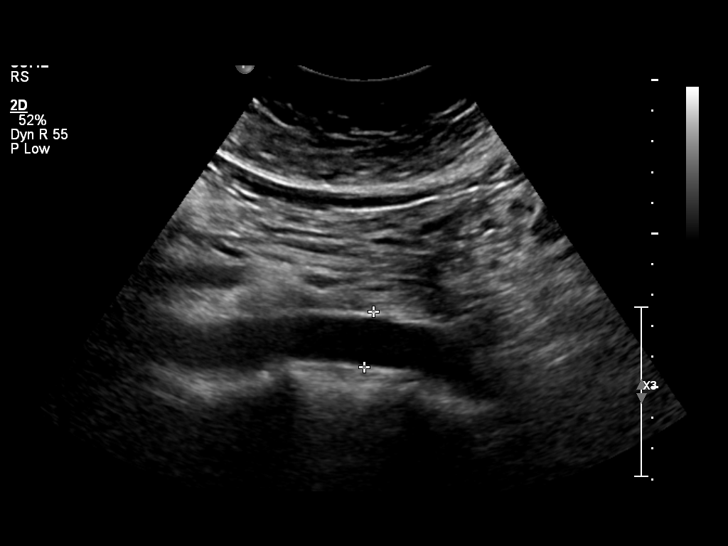
[im 56/67]
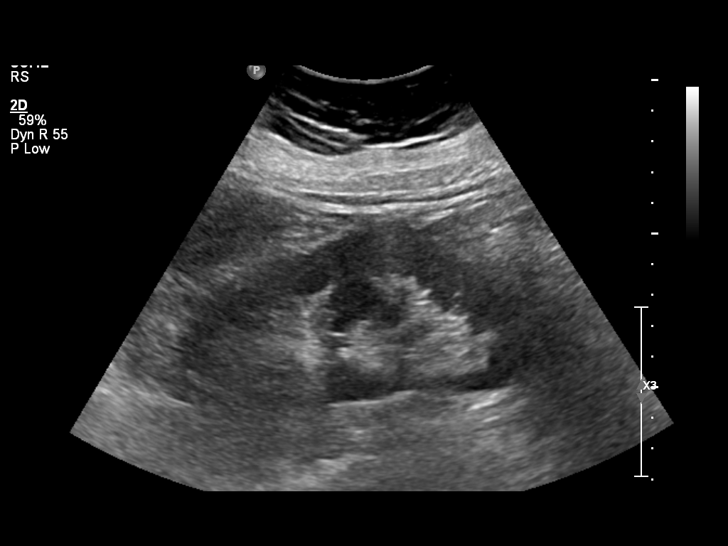
[im 61/67]
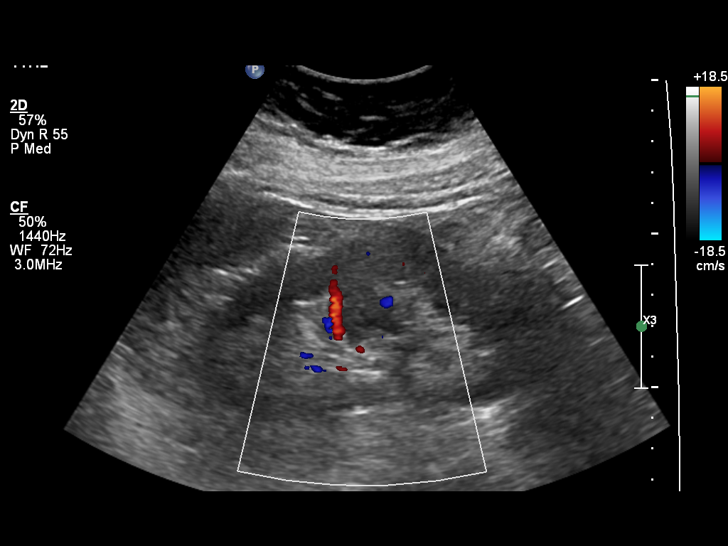
[im 67/67]
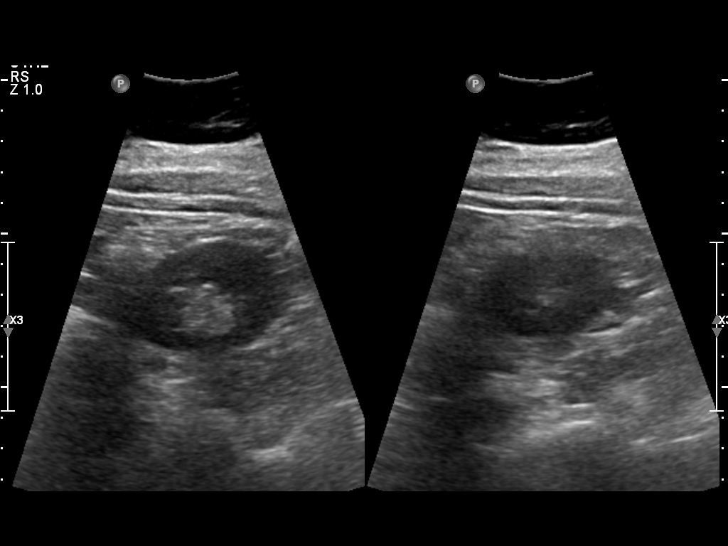

[14 of 25 positions shown; findings below may reference images not displayed]

Scan quality: Satisfactory. 
Liver: Enlarged measuring 18 cm in longitudinal length. No masses.
Portal vein: Patent with hepatopedal flow.
Gallbladder: Normal.
Intrahepatic bile ducts: Normal.
Extrahepatic bile ducts: Normal. Extrahepatic bile duct measures
mm. 
Pancreas: Mostly obscured.
Spleen: Normal. 
Right kidney: Spleen is normal measuring 10.3 x 10.1 x 4.8 cm.
Left kidney: Normal.
Aorta: Normal.
IVC: Patent.
Ascites: None.
IMPRESSION
IMPRESSION: 1. Hepatomegaly. No hepatic masses. 
2. Normal size spleen.
3. Normal gallbladder and bile ducts. 
4. Obscured pancreas.

## 2019-04-16 ENCOUNTER — Encounter: Admit: 2019-04-16 | Discharge: 2019-04-16

## 2019-04-20 ENCOUNTER — Encounter: Admit: 2019-04-20 | Discharge: 2019-04-20

## 2019-04-20 NOTE — Progress Notes
Spoke w/ patient to confirm telehealth appt     Instructed patient to complete blood work prior to appt. Faxed lab order to [facility] [place] fax# [phone]    Also instructed patient to have current med list available, during the time of the appt.

## 2019-04-23 ENCOUNTER — Encounter: Admit: 2019-04-23 | Discharge: 2019-04-23

## 2019-04-26 ENCOUNTER — Encounter: Admit: 2019-04-26 | Discharge: 2019-04-26

## 2019-04-26 ENCOUNTER — Ambulatory Visit: Admit: 2019-04-26 | Discharge: 2019-04-27

## 2019-04-26 DIAGNOSIS — Z8619 Personal history of other infectious and parasitic diseases: Secondary | ICD-10-CM

## 2019-04-26 DIAGNOSIS — B1920 Unspecified viral hepatitis C without hepatic coma: Secondary | ICD-10-CM

## 2019-04-26 DIAGNOSIS — Z98891 History of uterine scar from previous surgery: Secondary | ICD-10-CM

## 2019-04-26 DIAGNOSIS — F1910 Other psychoactive substance abuse, uncomplicated: Secondary | ICD-10-CM

## 2019-04-26 DIAGNOSIS — F329 Major depressive disorder, single episode, unspecified: Secondary | ICD-10-CM

## 2019-04-26 DIAGNOSIS — A4902 Methicillin resistant Staphylococcus aureus infection, unspecified site: Secondary | ICD-10-CM

## 2019-04-26 DIAGNOSIS — Z720 Tobacco use: Secondary | ICD-10-CM

## 2019-04-26 DIAGNOSIS — K769 Liver disease, unspecified: Secondary | ICD-10-CM

## 2019-04-26 NOTE — Progress Notes
The patient  reviewed the three documents sent via MyChart, agrees with the information and provided verbal consent for this visit.

## 2019-04-26 NOTE — Progress Notes
Date of Service: 04/26/2019     Subjective:          Toni Reed is a 48 y.o. female presenting for follow up of liver disease.  Obtained patient's verbal consent to treat them and their agreement to [facility] financial policy and NPP via this telehealth visit during the [facility] Emergency  History of Present Illness  Toni Reed is a 48 y.o. female  Ms. Arakelyan presents to hepatology clinic for continued management of chronic hepatitis C. Last seen in clinic 09/28/2018. ??????Referred by PCP Algis Greenhouse. PMH includes depression, multiple suicide attempts, MRSA. ???Dx in 2006 with HCV, genotype 1a.  Fibrotest Dec 2018 showed F0-1/4 fibrosis. 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  Treated with Harvoni x 8 weeks 12/21/17-02/17/18. HCV PCR became undetected week 8 of treatment.  SVR confirmed May 12, 2018 with undetected viral load.  Labs Nov 2019 stable.  MELD score 6 based on Nov 2019 labs.  AFP WNL.  CT of abdomen Jan 2020 noting normal size liver and appears grossly normal in morphology.  No liver lesions. Spleen is normal size. No ascites.   Only complaint is stress as she is working in [employer]. No positive Covid tests with residents and employees but nonetheless the work is stressful. In addition her mother passed away Apr 15, 2024and this was unexpected. She is coping better currently with this loss.  She denies N/V, abdominal pain, change in bowel habits, GI bleed, edema, confusion, ascites, chest pain, shortness of breath, cough, fever, chills.  No other changes or new symptoms.       Past Medical History:  Medical History:   Diagnosis Date   ??? Depression    ??? Hepatitis C     Genotype 1a   ??? History of cesarean section    ??? MRSA (methicillin resistant Staphylococcus aureus) infection    ??? Substance abuse [facility])        Surgical History:  Surgical History:   Procedure Laterality Date   ??? ANKLE FRACTURE TX     ??? TUBAL LIGATION       ???  Social History; ???+ tobacco use - < 1 PPD; denies h/o ETOH abuse, rarely consumes alcohol. + IVDU with meth starting age 48.???She has had periods of sobriety with relapses. She has attended drug rehab multiple times. Last use Jan 2018.???Divorced,lives alone. ???Employed full time as CNA at [employer].       Review of Systems  A complete 10 point review of systems was asked and answered in the negative unless specifically commented upon in the HPI.    Objective:         ??? acetaminophen (TYLENOL) 500 mg tablet Take 500 mg by mouth every 6 hours as needed for Pain. Max of 2,000 mg of acetaminophen in 24 hours.   ??? calcium carbonate (TUMS) 500 mg (200 mg elemental calcium) chewable tablet Chew 500 mg by mouth every 6 hours as needed.   ??? methyl salicylate/menthol (BEN GAY TP) Apply  topically to affected area.     Vitals:    04/26/19 1130   Weight: 87.1 kg (192 lb)   Height: 160 cm (63)   PainSc: Zero     Body mass index is 34.01 kg/m???.     Physical Exam  Vitals reviewed.   Constitutional: Well-developed, well-nourished, in no apparent distress.    Psychiatric: Normal mood and affect. Behavior is normal.  Neuro:  AAOx3.         Labs  and Diagnostic tests:  CBC w diff    Lab Results   Component Value Date/Time    WBC 7.3 09/28/2018 02:51 PM    RBC 4.98 09/28/2018 02:51 PM    HGB 15.7 (H) 09/28/2018 02:51 PM    HCT 46.0 (H) 09/28/2018 02:51 PM    MCV 92.5 09/28/2018 02:51 PM    MCH 31.6 09/28/2018 02:51 PM    MCHC 34.2 09/28/2018 02:51 PM    RDW 12.7 09/28/2018 02:51 PM    PLTCT 322 09/28/2018 02:51 PM    MPV 8.0 09/28/2018 02:51 PM    Lab Results   Component Value Date/Time    NEUT 57 09/28/2018 02:51 PM    ANC 4.20 09/28/2018 02:51 PM    LYMA 29 09/28/2018 02:51 PM    ALC 2.10 09/28/2018 02:51 PM    MONA 8 09/28/2018 02:51 PM    AMC 0.60 09/28/2018 02:51 PM    EOSA 5 09/28/2018 02:51 PM    AEC 0.40 09/28/2018 02:51 PM    BASA 1 09/28/2018 02:51 PM    ABC 0.10 09/28/2018 02:51 PM        Comprehensive Metabolic Profile    Lab Results Component Value Date/Time    NA 142 09/28/2018 02:51 PM    K 4.5 09/28/2018 02:51 PM    CL 103 09/28/2018 02:51 PM    CO2 29 09/28/2018 02:51 PM    GAP 10 09/28/2018 02:51 PM    BUN 13 09/28/2018 02:51 PM    CR 0.81 09/28/2018 02:51 PM    GLU 105 (H) 09/28/2018 02:51 PM    GLU 110 (H) 05/12/2018 08:59 AM    Lab Results   Component Value Date/Time    CA 10.1 09/28/2018 02:51 PM    ALBUMIN 4.8 09/28/2018 02:51 PM    TOTPROT 8.3 (H) 09/28/2018 02:51 PM    ALKPHOS 91 09/28/2018 02:51 PM    AST 20 09/28/2018 02:51 PM    ALT 18 09/28/2018 02:51 PM    TOTBILI 0.4 09/28/2018 02:51 PM    GFR >60 09/28/2018 02:51 PM    GFRAA >60 09/28/2018 02:51 PM          Hepatitis C:   Hepatitis C PCR Quantitative   Date/Time Value Ref Range Status   09/28/2018 02:51 PM HCV RNA Not Detected, <12 IU/mL <12 IU/ML Final     Comment:     The test method detects HCV viral load using the Abbott RealTime assay.    Please correlate results with the clinical status of the patient.       Hepatitis C Genotype PCR   Date/Time Value Ref Range Status   01/20/2013 03:25 PM   Final    Type 1a  Detects all 6 HCV genotypes including types 1-6 and subtypes 1a, 1b,  2a/2c, 2b.  This test was developed and its performance characteristics  determined by [employer]. It has not been cleared or  approved by the U.S. Food and Drug Administration.-Results should be  used in conjunction with clinical findings, and should not form the  sole basis for a diagnosis or treatment decision.-   PCR tests are performed pursuant to a license agreement with [employer].  ____________________________________________________________  Testing Performed At:  [facility].  [address]  [place], [place] [address]  [phone]  CLIA ID: [id number]       Hepatitis B:   HBsAg   Date/Time Value Ref Range Status   10/21/2017 10:47 AM  NEG NEG-NEG Final     Anti HBc Total   Date Value Ref Range Status   10/21/2017 NEG  Final Assessment and Plan:  History of Hepatitis C:  -- The patient had hepatitis C genotype 1a and completed treatment with Harvoni x 8 weeks 12/21/17-02/17/18.     - The patient???s 12 week post-treatment viral load was undetectable indicating she has achieved a sustained virological response (SVR), or cure, with HCV treatment as confirmed in July 2019.  I do recommend two additional HCV RNA viral load - every 6 months  to confirm SVR and ensure remission.  If that is negative, no further HCV viral loads need to be drawn unless there is concern for re-infection.  The patient will continue to remain HCV Antibody positive, but this only indicates prior exposure, not active infection. The patient is NOT immune to Hepatitis C and is at risk for re-infection if exposed to HCV again, so should avoid high risk behavior.    Staging of Fibrosis:  - Discussed importance of establishing significance of liver disease to guide future management.  Modalities include fibrosure, fibroscan, or percutaneous liver biopsy.    Fibrosure testing Dec 2018 showed F0-1/4 fibrosis.  Fibroscan performed Nov 2019 showing CAP 225, fibrosis 15.5kPa consistent with F4/4 cirrhosis.  This provides discrepancy with staging.   CT of abdomen Jan 2020 suggests liver is normal in size and appears grossly normal in morphology.  No dilated ducts.  No liver lesion to suggest HCC.  Spleen is normal in size. No ascites.   Discussed with pt imaging does not suggest stigmata of cirrhosis.  Recommend repeat US in 6 months and repeat fibroscan in 6 months at next visit to reassess liver and spleen characteristics.   May need to consider liver biopsy to confirm staging given difference in fibroscan and imaging liver characteristics.      Tobacco Abuse:  - The patient has a history of tobacco use and tobacco cessation was recommended.     Substance Abuse:   - The patient has a history of illicit substance abuse and abstinence was encouraged.     Adult Health Maintenance: - pt has not had colonoscopy for colorectal cancer screening.   - given age 77 recommend she schedule some time this year pending Covid 19 pandemic.     Immunizations:   Lab Results   Component Value Date/Time    HEPBSAB 47.1 09/28/2018 02:51 PM      Lab Results   Component Value Date/Time    HEPAIGG  01/20/2013 03:25 PM     POS  Note: Test type and methodology has changed.  The [facility] lab has discontinued the   test for Total Hep A Immunoglobulin.  This test has been replaced with more   specific testing.  The tests for Hep A IgG and Hep A IgM are both now available   and can be ordered.     Covid 19 pt education:  Discussed with pt precautions:  For up to date information on the COVID-19 virus, visit the [facility] website. [email or web address]  ? General supportive care during cold and flu season and infection prevention reminders:   o Wash hands often with soap and water for at least 20 seconds  o Cover your mouth and nose  o Social distancing: try to maintain 6 feet between you and other people  o Stay home if sick and symptoms mild or manageable  ? If you must be around people wear a mask    ?  If you are having symptoms of a lower respiratory infection (cough, shortness of breath) and/or fever AND either traveled in last 30 days (internationally or to region of exposure) OR known exposure to patient with COVID19:    o Call your primary care provider for questions or health needs.   ? Tell your doctor about your recent travel and your symptoms    o In a medical emergency, call 911 or go to the nearest emergency room.      Follow Up:   6 months in person clinic, or sooner if needed. The patient is to call our office with any questions.     Total time 25 minutes.  Estimated counseling time 20 minutes.  Counseled pt regarding lab/imaging results, post HCV treatment management, fibrosis staging, smoking cessation, covid 19 precautions, future followup. Thank you very much for the opportunity to participate in the care of this patient.  If you have any further questions, please don't hesitate to contact our office.    _____________________________  Franchot Mimes APRN   [facility]  Hepatology  Office Number: [phone]

## 2019-05-04 ENCOUNTER — Encounter: Admit: 2019-05-04 | Discharge: 2019-05-04

## 2019-05-04 NOTE — Progress Notes
Records request sent to [facility] in [facility] fax# [phone], ph [phone].     Lab orders also sent to [facility] in [facility].

## 2019-05-05 ENCOUNTER — Encounter: Admit: 2019-05-05 | Discharge: 2019-05-05

## 2019-05-05 DIAGNOSIS — K769 Liver disease, unspecified: Secondary | ICD-10-CM

## 2019-05-05 LAB — COMPREHENSIVE METABOLIC PANEL
Lab: 0.4 mg/dL (ref 0–0.20)
Lab: 0.7 mg/dL (ref 7–11)
Lab: 100 % — ABNORMAL HIGH (ref 65–99)
Lab: 101 mL/min/1.73m2 — ABNORMAL LOW (ref >60)
Lab: 109 mmol/L (ref 0–2)
Lab: 12 mg/dL (ref 150–400)
Lab: 14 U/L
Lab: 14 U/L
Lab: 141 mmol/L (ref >60)
Lab: 21 mmol/L (ref 1.8–7.0)
Lab: 3.9 mmol/L (ref 0–5)
Lab: 4.1 g/dL (ref 0–0.45)
Lab: 6.4 g/dL (ref 0–0.80)
Lab: 81 U/L
Lab: 87 mL/min/1.73m2 — ABNORMAL HIGH (ref 41–77)
Lab: 9.3 mg/dL — ABNORMAL LOW (ref 1.0–4.8)

## 2019-05-05 LAB — CBC AND DIFF: Lab: 6.7 g/dL — ABNORMAL HIGH (ref 32.0–36.0)

## 2019-05-05 LAB — PROTIME INR (PT)
Lab: 0.9 M/UL (ref 4.0–5.0)
Lab: 9.6 s (ref 12.0–15.0)

## 2019-05-06 ENCOUNTER — Encounter: Admit: 2019-05-06 | Discharge: 2019-05-06

## 2019-05-06 DIAGNOSIS — Z8619 Personal history of other infectious and parasitic diseases: Secondary | ICD-10-CM

## 2019-05-06 DIAGNOSIS — K769 Liver disease, unspecified: Secondary | ICD-10-CM

## 2019-05-06 LAB — ALPHA FETO PROTEIN (AFP): Lab: 3.4

## 2019-05-06 LAB — HIV 1& 2 AG-AB SCRN W REFLEX HIV 1 PCR QUANT

## 2019-05-09 ENCOUNTER — Encounter: Admit: 2019-05-09 | Discharge: 2019-05-09

## 2019-05-09 DIAGNOSIS — K769 Liver disease, unspecified: Secondary | ICD-10-CM

## 2019-05-09 DIAGNOSIS — Z8619 Personal history of other infectious and parasitic diseases: Secondary | ICD-10-CM

## 2019-07-27 ENCOUNTER — Encounter: Admit: 2019-07-27 | Discharge: 2019-07-27 | Payer: 59

## 2019-07-29 ENCOUNTER — Encounter: Admit: 2019-07-29 | Discharge: 2019-07-29 | Payer: 59

## 2019-07-29 NOTE — Progress Notes
Scheduled 37mo fu 10/22/19 @ 10am.  Mailed patient an appt reminder.

## 2019-08-06 ENCOUNTER — Encounter: Admit: 2019-08-06 | Discharge: 2019-08-06 | Payer: 59

## 2019-08-06 NOTE — Telephone Encounter
Call to patient to request that let us know where last labs were done as the fax we received did not have all identifying information we need.    Will use information provided to contact lab.

## 2019-08-06 NOTE — Telephone Encounter
-----   Message from Katharina Caper sent at 08/03/2019  7:10 AM CDT -----  Regarding: RE: Missing Collection Date  Good morning Randall Hiss,    The lab in question is currently in the skip hep que.  I do not see the name of the facility that collected the Hep C order or any contact information for them.  Would you prefer that we scan this lab into the doctor's folder so that you may view it as a scanned doc?    Thank you,    Rosalin Hawking  ----- Message -----  From: Zoila Shutter  Sent: 08/03/2019   5:56 AM CDT  To: Katharina Caper  Subject: FW: Missing Collection Date                        ----- Message -----  From: Gerre Couch, RN  Sent: 08/02/2019   8:37 AM CDT  To: Zoila Shutter  Subject: RE: Missing Collection Date                      Please use the Sept 15 external order to enter in the lab data.  I can't see the scan you are looking at until it is scanned into the chart.  I hope this answered your question.    Perhaps an external lab sent [place] a lab result without the collection date and time?  Then we need to contact the external lab for a re-transmission of the results.    Randall Hiss  ----- Message -----  From: Zoila Shutter  Sent: 08/02/2019   6:42 AM CDT  To: Gerre Couch, RN, Katharina Caper  Subject: Missing Collection Date                          [place],     We have a hep c on this patient, but we are missing a collection date and time. We are requesting your assistance, if possible.     Thank you.

## 2019-08-17 ENCOUNTER — Encounter: Admit: 2019-08-17 | Discharge: 2019-08-17 | Payer: 59

## 2019-10-19 ENCOUNTER — Encounter: Admit: 2019-10-19 | Discharge: 2019-10-19 | Payer: 59

## 2019-10-19 NOTE — Telephone Encounter
Spoke with pt regarding upcoming appts:    U/S on 12/11 at 10 am, npo 6 hrs prior  F/U with N. Todd on 12/15 at 11:20 am   Both appts confirmed and will complete labs prior to appt.

## 2020-01-17 ENCOUNTER — Encounter: Admit: 2020-01-17 | Discharge: 2020-01-17 | Payer: 59

## 2020-01-17 NOTE — Telephone Encounter
LVM with radiology scheduling number [phone] to schedule imaging exam

## 2020-02-18 ENCOUNTER — Encounter: Admit: 2020-02-18 | Discharge: 2020-02-18 | Payer: 59

## 2020-02-18 NOTE — Telephone Encounter
LVM with radiology scheduling number [phone] to schedule imaging exam

## 2020-03-27 ENCOUNTER — Encounter: Admit: 2020-03-27 | Discharge: 2020-03-27 | Payer: 59

## 2020-03-27 NOTE — Telephone Encounter
LVM with radiology scheduling number [phone] to schedule imaging exam

## 2020-04-12 ENCOUNTER — Encounter: Admit: 2020-04-12 | Discharge: 2020-04-12 | Payer: 59

## 2020-09-15 ENCOUNTER — Encounter: Admit: 2020-09-15 | Discharge: 2020-09-15 | Payer: 59

## 2023-02-24 ENCOUNTER — Encounter: Admit: 2023-02-24 | Discharge: 2023-02-24 | Payer: 59
